# Patient Record
Sex: Female | Born: 1970 | Race: White | Hispanic: No | Smoking: Never smoker
Health system: Southern US, Community
[De-identification: ages and names within clinical notes are randomized; demographics above are authoritative.]

## PROBLEM LIST (undated history)

## (undated) HISTORY — PX: CHOLECYSTECTOMY: SHX55

## (undated) HISTORY — PX: TUBAL LIGATION: SHX77

---

## 2003-09-01 ENCOUNTER — Other Ambulatory Visit: Admission: RE | Admit: 2003-09-01 | Discharge: 2003-09-01 | Payer: Self-pay | Admitting: Obstetrics and Gynecology

## 2006-09-13 ENCOUNTER — Emergency Department (HOSPITAL_COMMUNITY): Admission: EM | Admit: 2006-09-13 | Discharge: 2006-09-13 | Payer: Self-pay | Admitting: Emergency Medicine

## 2006-10-29 ENCOUNTER — Ambulatory Visit (HOSPITAL_COMMUNITY): Admission: RE | Admit: 2006-10-29 | Discharge: 2006-10-30 | Payer: Self-pay | Admitting: Surgery

## 2006-10-29 ENCOUNTER — Encounter (INDEPENDENT_AMBULATORY_CARE_PROVIDER_SITE_OTHER): Payer: Self-pay | Admitting: Surgery

## 2007-05-25 ENCOUNTER — Emergency Department (HOSPITAL_COMMUNITY): Admission: EM | Admit: 2007-05-25 | Discharge: 2007-05-25 | Payer: Self-pay | Admitting: Family Medicine

## 2008-04-20 IMAGING — US US ABDOMEN COMPLETE
1 series · 14 of 25 positions shown · non-contrast
Comparison: none

CLINICAL DATA: Abdominal pain, nausea, and vomiting.
 ABDOMEN ULTRASOUND COMPLETE ? 09/13/06:
TECHNIQUE: Complete abdominal ultrasound examination was performed including evaluation of the liver, gallbladder, bile ducts, pancreas, kidneys, spleen, IVC, and abdominal aorta.
 No prior studies available for comparison.

[Series 1: unknown · 0.37mm/px · 14 of 62 slices shown]
[im 1/62]
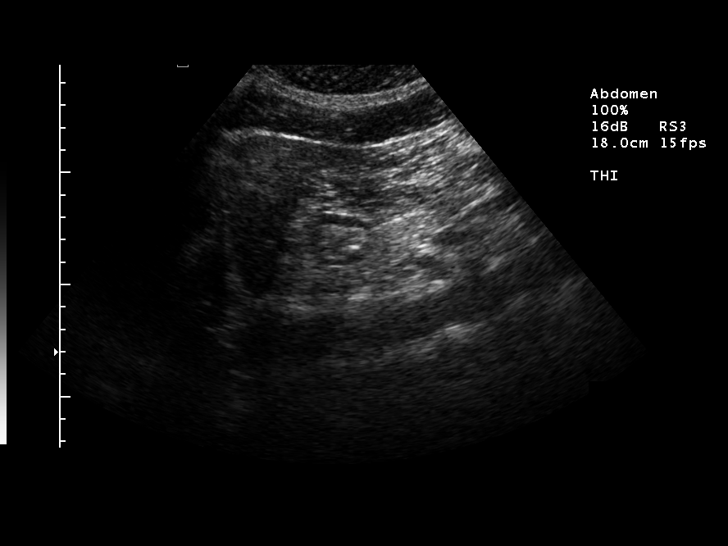
[im 6/62]
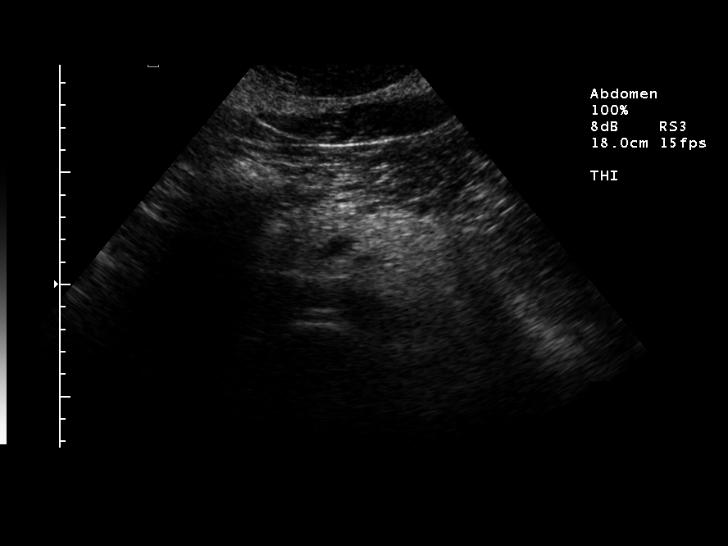
[im 11/62]
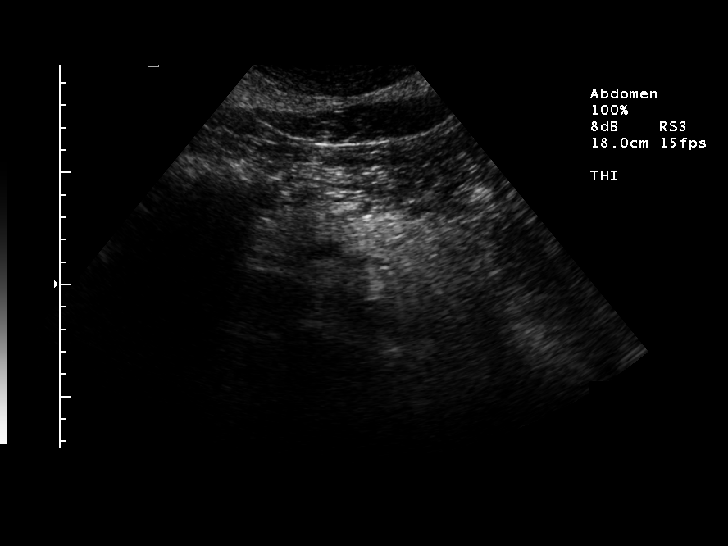
[im 16/62]
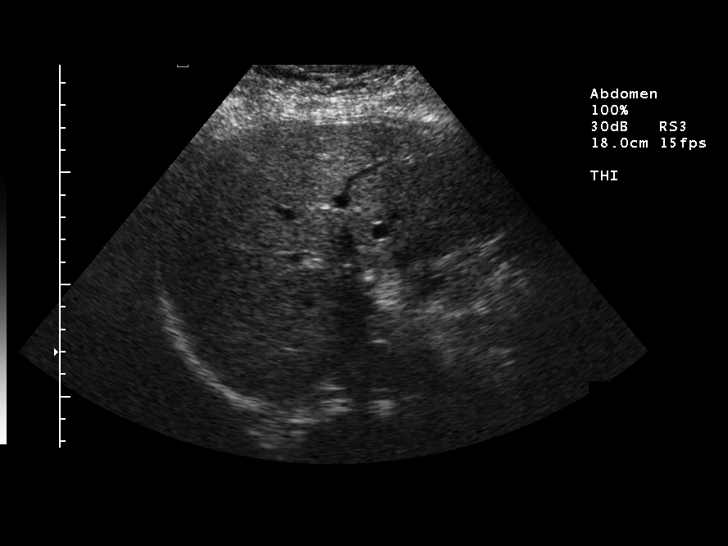
[im 21/62]
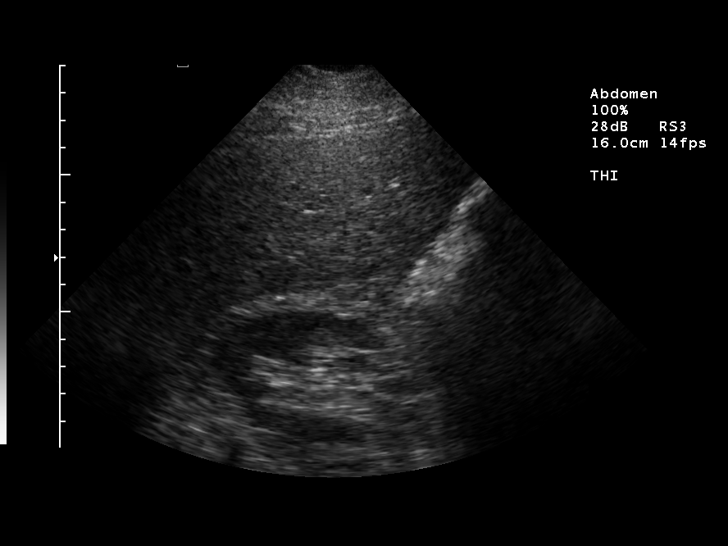
[im 23/62]
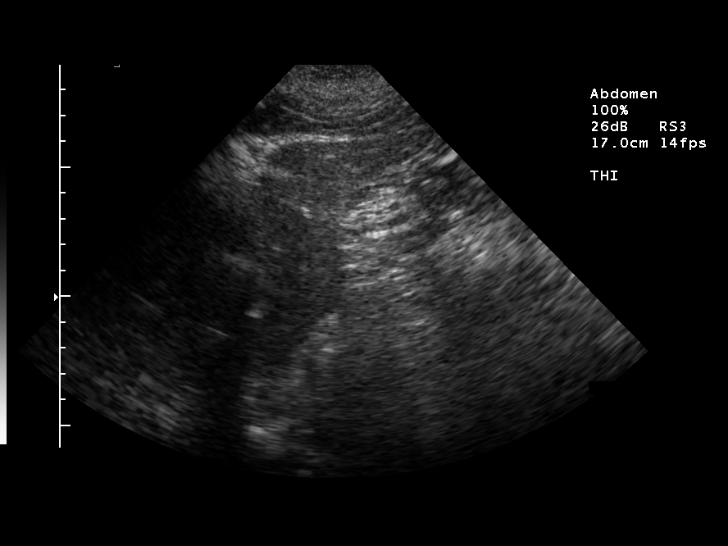
[im 28/62]
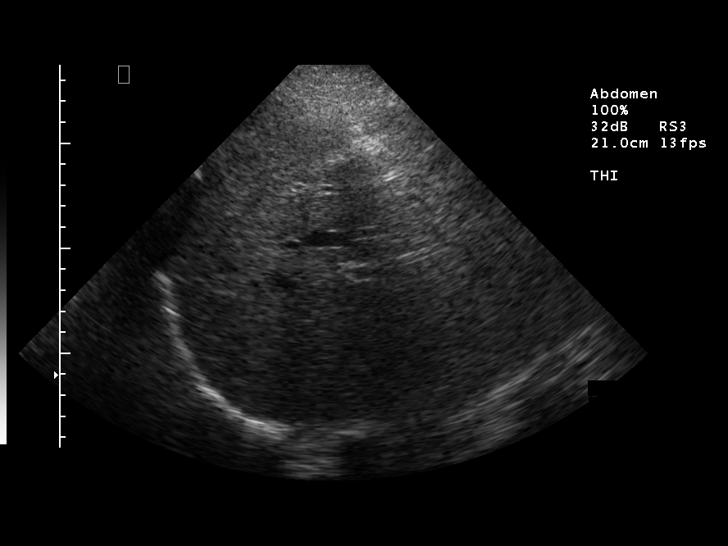
[im 34/62]
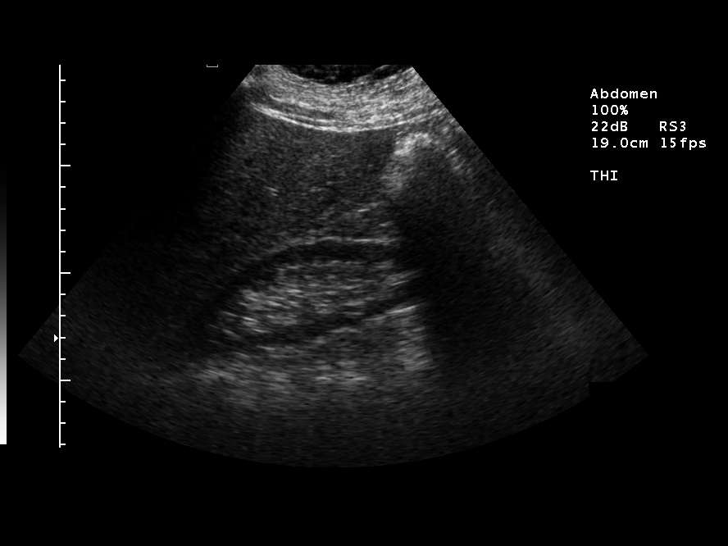
[im 39/62]
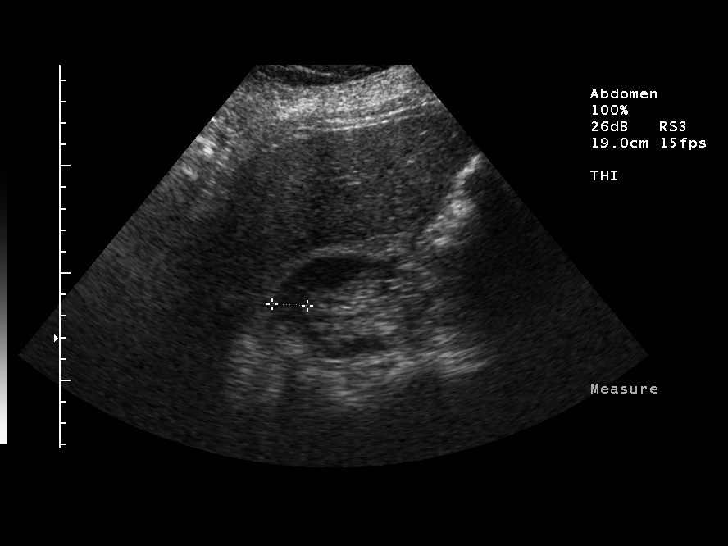
[im 41/62]
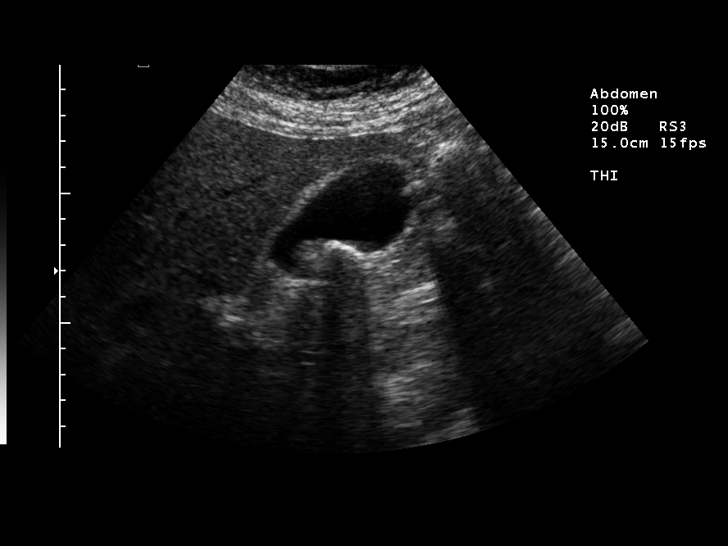
[im 46/62]
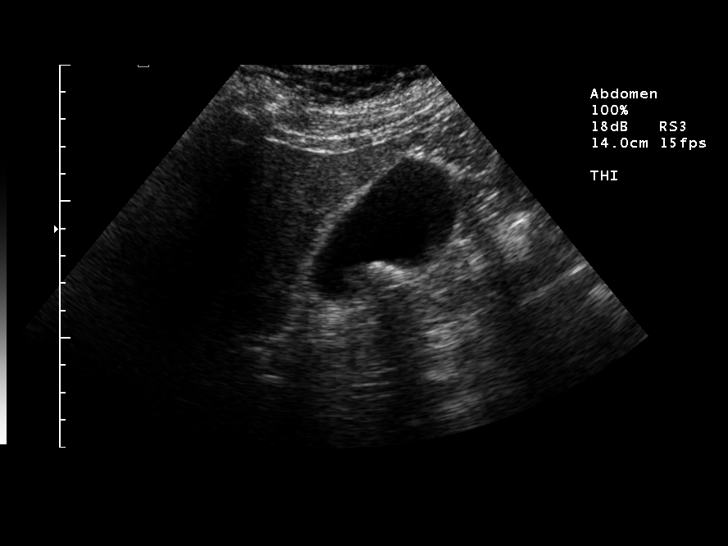
[im 51/62]
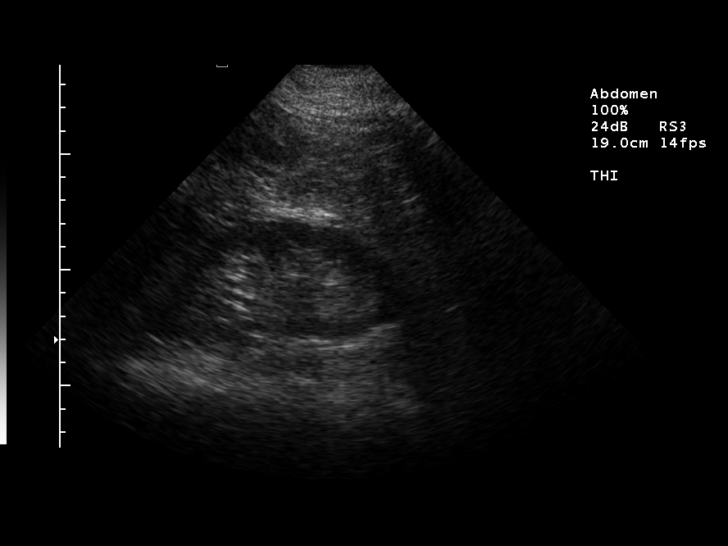
[im 56/62]
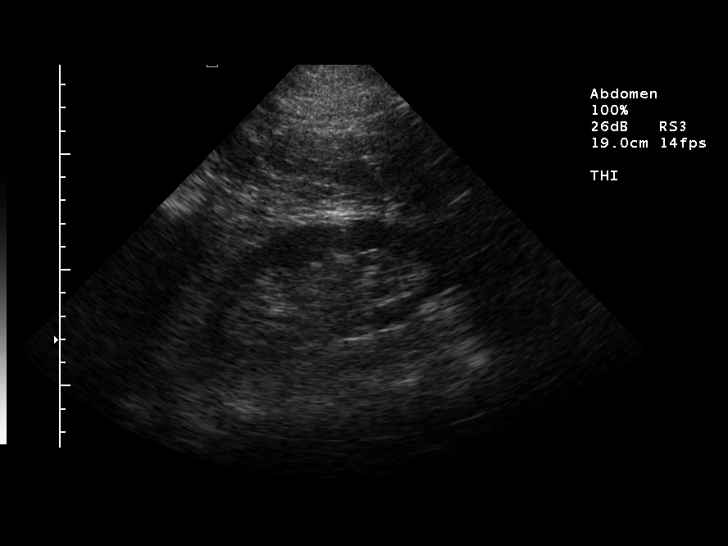
[im 62/62]
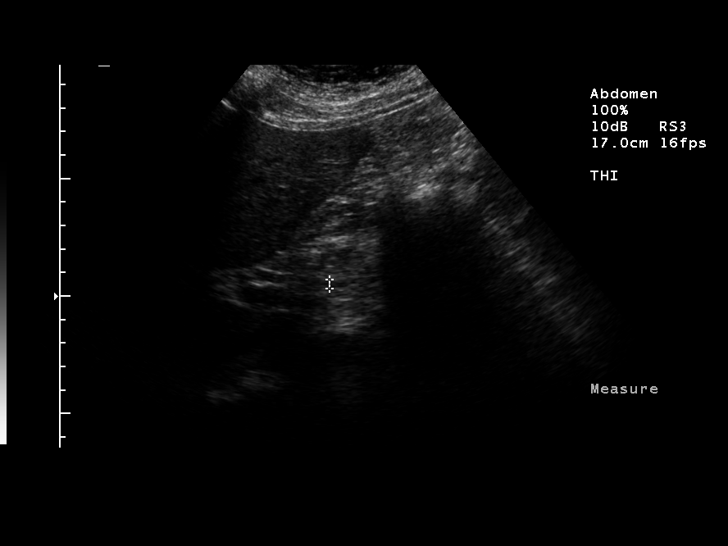

[14 of 25 positions shown; findings below may reference images not displayed]

FINDINGS: Gallbladder:  Multiple mobile gallstones are present demonstrating posterior acoustical shadowing.  The gallbladder wall is of borderline thickness measuring up to 3 mm.  No pericholecystic fluid is seen.
 Common Bile Duct:  Normal caliber.  3.8 mm.
 Liver:  Mild fatty infiltration.  No focal abnormalities or intrahepatic biliary ductal dilatation.
 IVC:  Normally patent.
 Pancreas:  Normal by ultrasound.
 Spleen:  Normal echotexture and size.
 Kidneys:  Normal bilaterally.  Right 13.5 cm.  Left 12.7 cm.
 Abdominal Aorta:  Normal caliber.
IMPRESSION: Cholelithiasis with multiple gallstones.  Borderline wall thickness of the gallbladder.  No evidence of biliary obstruction.

## 2008-06-05 IMAGING — RF DG CHOLANGIOGRAM OPERATIVE
1 series · 20 of 20 positions shown · non-contrast
Comparison: none

CLINICAL DATA: OPERATIVE CHOLANGIOGRAM:

[Series 1: run · 20 of 20 slices shown]
[im 1/20]
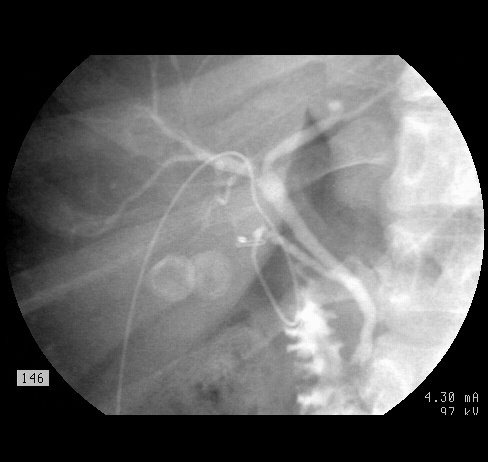
[im 2/20]
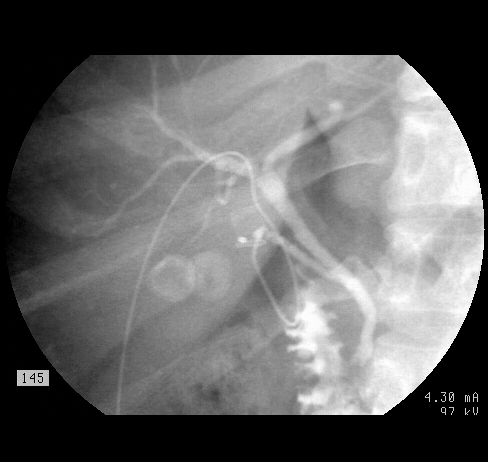
[im 3/20]
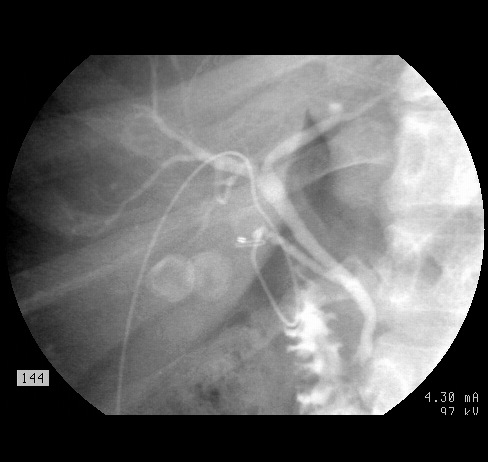
[im 4/20]
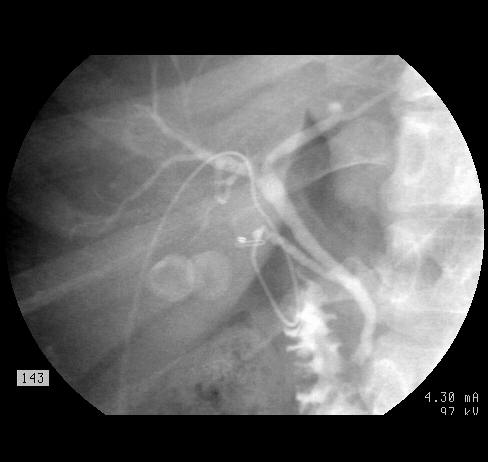
[im 5/20]
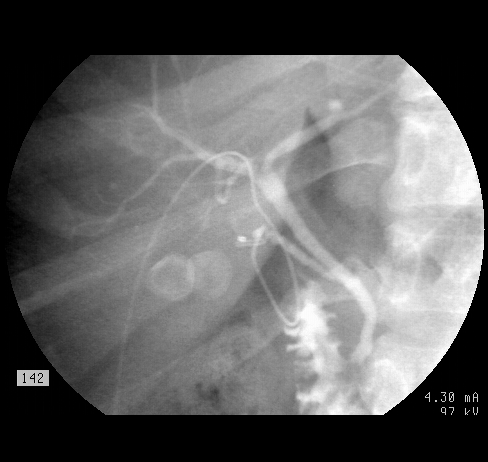
[im 6/20]
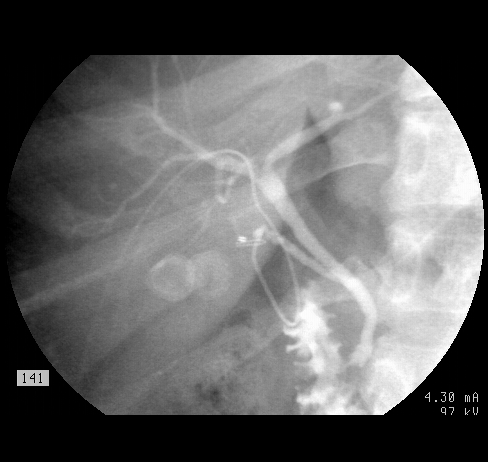
[im 7/20]
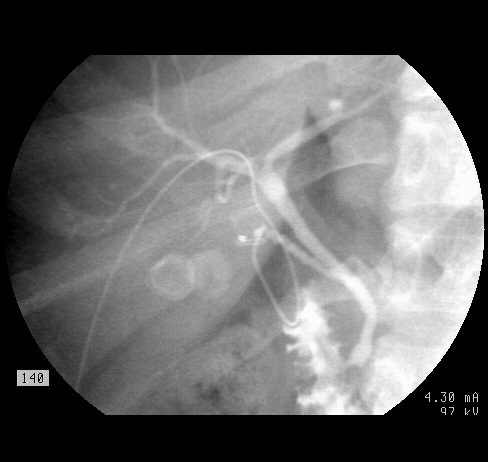
[im 8/20]
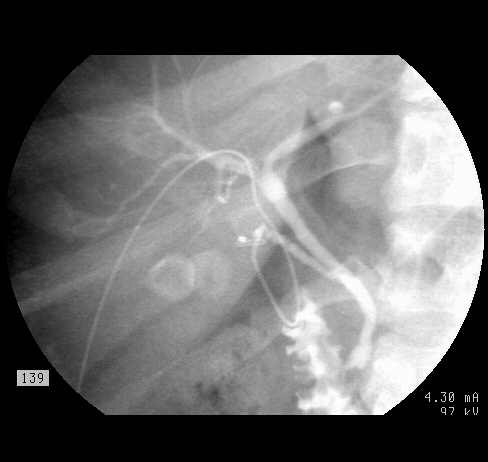
[im 9/20]
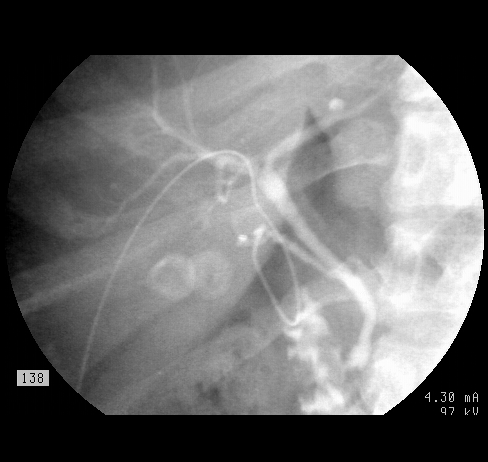
[im 10/20]
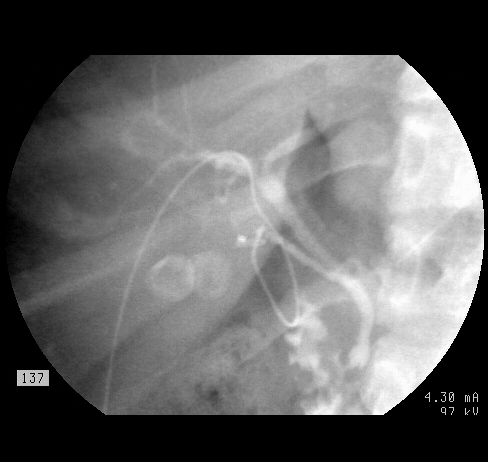
[im 11/20]
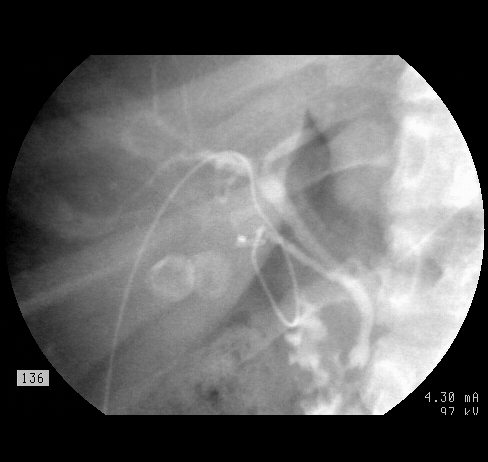
[im 12/20]
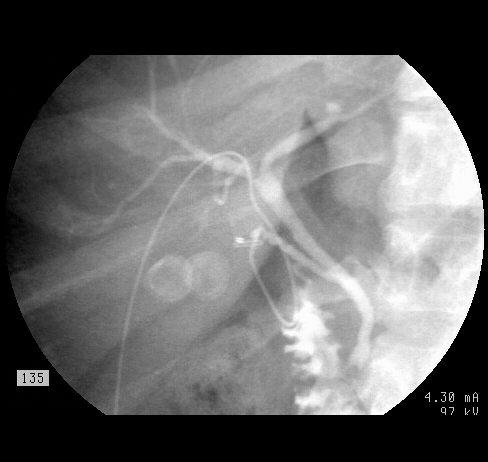
[im 13/20]
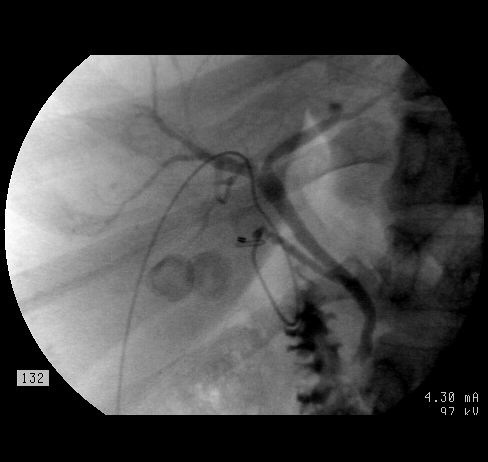
[im 14/20]
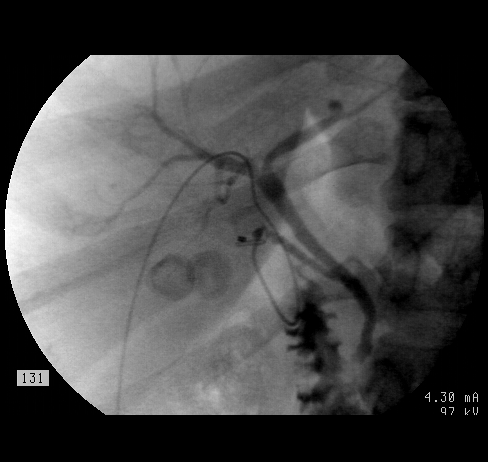
[im 15/20]
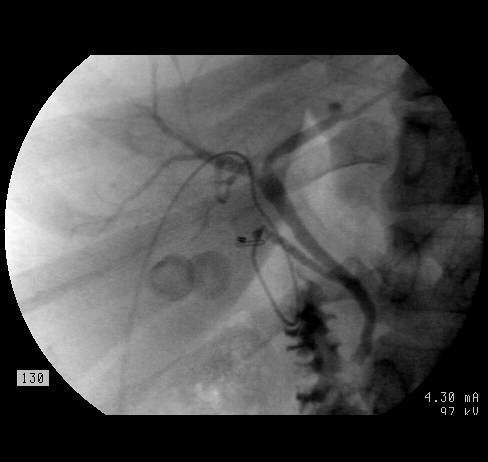
[im 16/20]
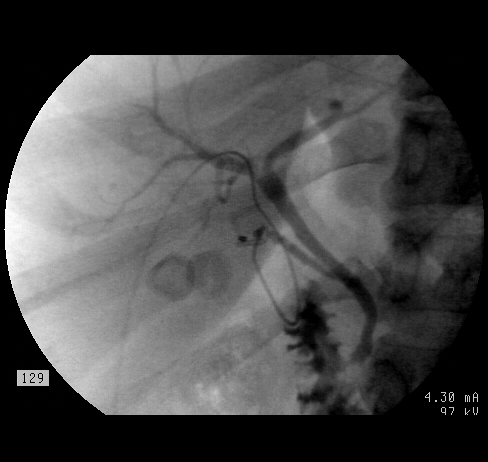
[im 17/20]
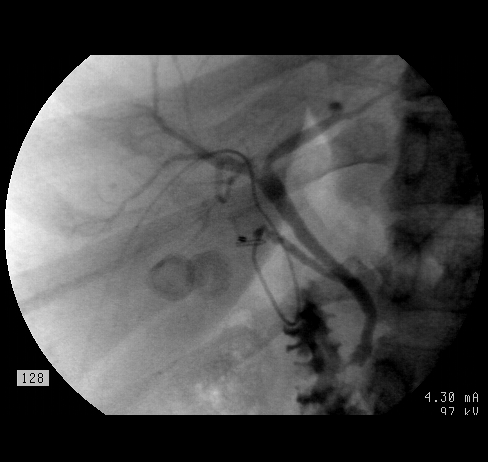
[im 18/20]
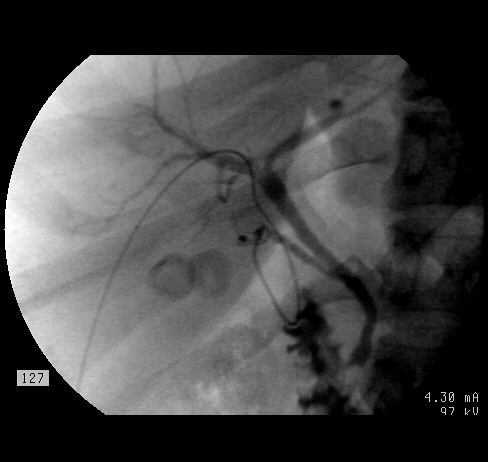
[im 19/20]
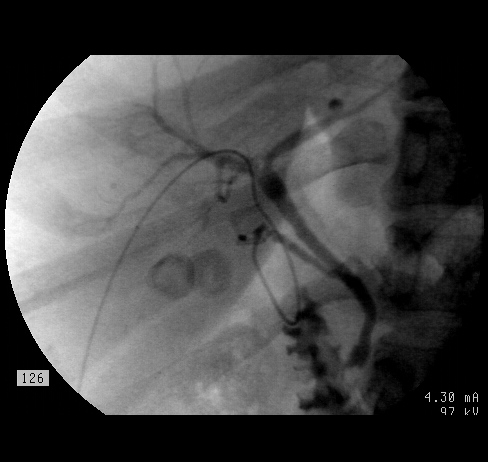
[im 20/20]
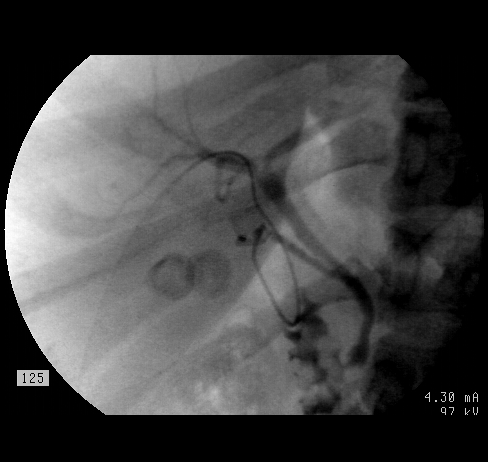

[20 of 20 positions shown; findings below may reference images not displayed]

FINDINGS: C-arm spot films from an operative cholangiogram were returned.  Contrast was injected via the cystic duct.  There is good visualization of the common bile duct. No retained calculi are seen and there is passage of contrast into the duodenum.
IMPRESSION: Negative intraoperative cholangiogram.

## 2010-02-09 DEATH — deceased

## 2010-09-24 NOTE — Op Note (Signed)
NAMEMarland Miles  ELMIRA, OLKOWSKI NO.:  [id number]   MEDICAL RECORD NO.:  [id number]          PATIENT TYPE:  OIB   LOCATION:  0098                         FACILITY:  [facility]   PHYSICIAN:  Wilmon Arms. Corliss Skains, M.D. DATE OF BIRTH:  1970-11-13   DATE OF PROCEDURE:  10/29/2006  DATE OF DISCHARGE:                               OPERATIVE REPORT   PREOPERATIVE DIAGNOSIS:  Chronic calculus cholecystitis.   POSTOPERATIVE DIAGNOSIS:  Chronic calculus cholecystitis.   PROCEDURE PERFORMED:  Laparoscopic cholecystectomy with intraoperative  cholangiogram.   SURGEON:  Wilmon Arms. Corliss Skains, M.D.   ASSISTANT:  Velora Heckler, MD   ANESTHESIA:  General endotracheal.   INDICATIONS:  The patient is a 40 year old female who presents after  recent acute episode of upper abdominal pain, nausea and vomiting.  White count was obtained which showed it to be elevated at 14.3.  Liver  functions were normal.  The ultrasound showed several large gallstones  with borderline wall thickening.  The patient's symptoms have resolved.  The patient has had previous episodes.  She now presents for elective  cholecystectomy.   DESCRIPTION OF PROCEDURE:  The patient is brought to the operating room,  placed in supine position on operating table.  After an adequate level  of general anesthesia was obtained, the patient's abdomen was prepped  with Betadine and draped in sterile fashion.  A time-out was taken  assure proper patient, proper procedure.  The patient's umbilicus  infiltrated 0.25% Marcaine.  A transverse incision was made below  umbilicus.  Dissection was carried down to the fascia which was opened  vertically.  The peritoneal cavity was bluntly entered.  A stay sutures  of Vicryl as placed around the fascial opening.  The Hasson cannula was  inserted and secured with a stay suture.  Pneumoperitoneum is obtained  by insufflating CO2 maintaining maximal pressure of 15 mmHg.  The  patient was tilted in  reverse Trendelenburg and tilted to the left.  A  10 mm port was placed in subxiphoid position.  Two 5 mm ports placed in  right upper quadrant.  The gallbladder was grasped with clamp and  elevated over the edge of the liver.  The omental adhesions were taken  down with cautery.  We opened the peritoneum around the hilum of the  gallbladder.  The cystic duct was identified and was circumferentially  dissected.  It was ligated with clip distally.  An opening was created  on the cystic duct.  A Cook cholangiogram catheter was brought out  through a stab incision threaded in the cystic duct.  A cholangiogram  was obtained which showed good flow proximally and distally in the  biliary tree with a very long cystic duct stump.  Contrast flowed easily  into the duodenum.  At the ampulla there seemed to be some tortuosity of  the duct but no clear filling defect.  We confirmed with the radiologist  who agreed that there was no definite obstruction here.  The patient's  preoperative liver functions were normal.  We chose to terminate the  cholangiogram at this time.  The catheter  was then removed and the  cystic duct was ligated, clipped and divided.  The cystic artery was  also ligated with clips and divided.  Cautery was then used to remove  the gallbladder from the liver bed.  The gallbladder was placed in  EndoCatch sac and removed through the umbilical port site.  We  reinspected right upper quadrant for bleeding.  Pneumoperitoneum was  released and the ports were  removed.  The stay sutures used to close the umbilical fascia.  4-0  Monocryl was used to close skin incisions.  Steri-Strips and clean  dressings were applied.  The patient was extubated and brought to  recovery in stable condition.  All sponge, instrument, needle counts  correct.      Wilmon Arms. Tsuei, M.D.  Electronically Signed     MKT/MEDQ  D:  10/29/2006  T:  10/29/2006  Job:  [id number]

## 2011-02-26 LAB — DIFFERENTIAL
Eosinophils Absolute: 0.1
Lymphocytes Relative: 22
Lymphs Abs: 2.7
Neutro Abs: 8.5 — ABNORMAL HIGH
Neutrophils Relative %: 69

## 2011-02-26 LAB — COMPREHENSIVE METABOLIC PANEL
Albumin: 3.3 — ABNORMAL LOW
BUN: 10
Calcium: 8.9
Creatinine, Ser: 0.56
Glucose, Bld: 94
Total Protein: 7

## 2011-02-26 LAB — CBC
Hemoglobin: 10 — ABNORMAL LOW
MCHC: 31.5
MCV: 69.9 — ABNORMAL LOW
RBC: 4.56
RDW: 16.1 — ABNORMAL HIGH

## 2011-05-11 ENCOUNTER — Emergency Department (HOSPITAL_BASED_OUTPATIENT_CLINIC_OR_DEPARTMENT_OTHER)
Admission: EM | Admit: 2011-05-11 | Discharge: 2011-05-11 | Disposition: A | Discharge status: Home / Self Care | Payer: Managed Care, Other (non HMO) | Attending: Emergency Medicine | Admitting: Emergency Medicine

## 2011-05-11 DIAGNOSIS — J069 Acute upper respiratory infection, unspecified: Secondary | ICD-10-CM

## 2011-05-11 DIAGNOSIS — J40 Bronchitis, not specified as acute or chronic: Secondary | ICD-10-CM | POA: Insufficient documentation

## 2011-05-11 MED ORDER — ALBUTEROL SULFATE HFA 108 (90 BASE) MCG/ACT IN AERS: 1.0000 | INHALATION_SPRAY | Freq: Four times a day (QID) | RESPIRATORY_TRACT | Status: AC | PRN

## 2011-05-11 MED ORDER — HYDROCOD POLST-CHLORPHEN POLST 10-8 MG/5ML PO LQCR: 5.0000 mL | Freq: Two times a day (BID) | ORAL | Status: AC

## 2011-05-11 NOTE — ED Provider Notes (Signed)
History     CSN: [id number]  Arrival date & time 05/11/11  1119   First MD Initiated Contact with Patient 05/11/11 1208      Chief Complaint  Patient presents with  . URI   Patient reports sore throat, nasal congestion, cough for one week. She's had some intermittent temperatures around 100. She states she initially thought she was getting better but then this morning had worsening cough, congestion, and stuffy nose. She's had no vomiting, or chest pain. No dizziness or syncope. She was initially using Tussionex, which was helping her. (Consider location/radiation/quality/duration/timing/severity/associated sxs/prior treatment) HPI  History reviewed. No pertinent past medical history.  Past Surgical History  Procedure Date  . Cesarean section   . Cholecystectomy   . Tubal ligation     No family history on file.  History  Substance Use Topics  . Smoking status: Never Smoker   . Smokeless tobacco: Not on file  . Alcohol Use: Yes    OB History    Grav Para Term Preterm Abortions TAB SAB Ect Mult Living                  Review of Systems  All other systems reviewed and are negative.    Allergies  Review of patient's allergies indicates no known allergies.  Home Medications  No current outpatient prescriptions on file.  BP 137/70  Pulse 112  Temp(Src) 98.6 F (37 C) (Oral)  Resp 20  Ht 5\' 5"  (1.651 m)  Wt 260 lb (117.935 kg)  BMI 43.27 kg/m2  SpO2 100%  LMP 05/01/2011  Physical Exam  Nursing note and vitals reviewed. Constitutional: She is oriented to person, place, and time. She appears well-developed and well-nourished.  HENT:  Head: Normocephalic and atraumatic.  Mouth/Throat: Oropharynx is clear and moist. No oropharyngeal exudate.  Eyes: Conjunctivae and EOM are normal. Pupils are equal, round, and reactive to light.  Neck: Neck supple.  Cardiovascular: Normal rate and regular rhythm.  Exam reveals no gallop and no friction rub.   No murmur  heard. Pulmonary/Chest: Effort normal and breath sounds normal. No respiratory distress. She has no wheezes. She has no rales. She exhibits no tenderness.       Dry cough. Otherwise, normal. No wheezing, rhonchi or rails  Abdominal: Soft. Bowel sounds are normal. She exhibits no distension. There is no tenderness. There is no rebound and no guarding.  Musculoskeletal: Normal range of motion.  Neurological: She is alert and oriented to person, place, and time. No cranial nerve deficit. Coordination normal.  Skin: Skin is warm and dry. No rash noted.  Psychiatric: She has a normal mood and affect.    ED Course  Procedures (including critical care time)  Labs Reviewed - No data to display No results found.   No diagnosis found.    MDM  Pt is seen and examined;  Initial history and physical completed.  Will follow.          Tayna Smethurst A. Patrica Duel, MD 05/11/11 [phone]

## 2011-05-11 NOTE — ED Notes (Signed)
C/o sore throat, nasal congestion, fever x 1 week

## 2013-05-20 ENCOUNTER — Encounter: Payer: Self-pay | Admitting: Internal Medicine

## 2013-05-20 ENCOUNTER — Ambulatory Visit: Payer: Self-pay | Attending: Internal Medicine | Admitting: Internal Medicine

## 2013-05-20 VITALS — BP 129/83 | HR 117 | Temp 98.7°F | Resp 14 | Ht 66.0 in | Wt 274.6 lb

## 2013-05-20 DIAGNOSIS — N946 Dysmenorrhea, unspecified: Secondary | ICD-10-CM | POA: Insufficient documentation

## 2013-05-20 DIAGNOSIS — R0602 Shortness of breath: Secondary | ICD-10-CM

## 2013-05-20 DIAGNOSIS — N644 Mastodynia: Secondary | ICD-10-CM

## 2013-05-20 DIAGNOSIS — M25552 Pain in left hip: Secondary | ICD-10-CM

## 2013-05-20 DIAGNOSIS — M25559 Pain in unspecified hip: Secondary | ICD-10-CM

## 2013-05-20 DIAGNOSIS — N949 Unspecified condition associated with female genital organs and menstrual cycle: Secondary | ICD-10-CM | POA: Insufficient documentation

## 2013-05-20 LAB — COMPLETE METABOLIC PANEL WITH GFR
ALBUMIN: 3.8 g/dL (ref 3.5–5.2)
ALK PHOS: 94 U/L (ref 39–117)
ALT: 8 U/L (ref 0–35)
AST: 9 U/L (ref 0–37)
BILIRUBIN TOTAL: 0.4 mg/dL (ref 0.3–1.2)
BUN: 11 mg/dL (ref 6–23)
CO2: 25 mEq/L (ref 19–32)
Calcium: 9 mg/dL (ref 8.4–10.5)
Chloride: 102 mEq/L (ref 96–112)
Creat: 0.66 mg/dL (ref 0.50–1.10)
GFR, Est African American: >89 mL/min
GLUCOSE: 115 mg/dL — AB (ref 70–99)
POTASSIUM: 3.8 meq/L (ref 3.5–5.3)
SODIUM: 134 meq/L — AB (ref 135–145)
Total Protein: 7.4 g/dL (ref 6.0–8.3)

## 2013-05-20 LAB — LIPID PANEL
CHOL/HDL RATIO: 3.9 ratio
Cholesterol: 129 mg/dL (ref 0–200)
HDL: 33 mg/dL — AB (ref >39)
LDL Cholesterol: 65 mg/dL (ref 0–99)
TRIGLYCERIDES: 154 mg/dL — AB (ref <150)
VLDL: 31 mg/dL (ref 0–40)

## 2013-05-20 LAB — CBC WITH DIFFERENTIAL/PLATELET
BASOS ABS: 0.1 10*3/uL (ref 0.0–0.1)
BASOS PCT: 1 % (ref 0–1)
EOS ABS: 0.4 10*3/uL (ref 0.0–0.7)
Eosinophils Relative: 3 % (ref 0–5)
HCT: 31.4 % — ABNORMAL LOW (ref 36.0–46.0)
Hemoglobin: 9.8 g/dL — ABNORMAL LOW (ref 12.0–15.0)
Lymphocytes Relative: 23 % (ref 12–46)
Lymphs Abs: 3.4 10*3/uL (ref 0.7–4.0)
MCH: 20.1 pg — AB (ref 26.0–34.0)
MCHC: 31.2 g/dL (ref 30.0–36.0)
MCV: 64.3 fL — ABNORMAL LOW (ref 78.0–100.0)
MONO ABS: 1.3 10*3/uL — AB (ref 0.1–1.0)
Monocytes Relative: 9 % (ref 3–12)
NEUTROS ABS: 9.6 10*3/uL — AB (ref 1.7–7.7)
NEUTROS PCT: 64 % (ref 43–77)
Platelets: 406 10*3/uL — ABNORMAL HIGH (ref 150–400)
RBC: 4.88 MIL/uL (ref 3.87–5.11)
RDW: 16.4 % — AB (ref 11.5–15.5)
WBC: 14.8 10*3/uL — ABNORMAL HIGH (ref 4.0–10.5)

## 2013-05-20 LAB — POCT GLYCOSYLATED HEMOGLOBIN (HGB A1C): HEMOGLOBIN A1C: 5.2

## 2013-05-20 MED ORDER — FUROSEMIDE 20 MG PO TABS: 20.0000 mg | ORAL_TABLET | Freq: Every day | ORAL | Status: AC

## 2013-05-20 NOTE — Progress Notes (Signed)
Pt is here to establish care. Complains of bilateral feet and leg swelling x3-5 years. Also complains of mood swings and highly stressed. Mother was recently diagnosised with bipolar and schizophrenia. Pt is worried that she could have a similar diagnosis. Extremely heavy and painful menstrual periods x4 months. Wants to know what could be done about.

## 2013-05-20 NOTE — Progress Notes (Signed)
Patient ID: Sara Miles, female   DOB: [birth year], 43 y.o.   MRN: [id number]   CC:  HPI: 43 year old female here to establish care. She has multiple complaints. She feels depressed. She has gained about 50 pounds in the last 20 years. Does not exercise. She complains of bilateral lower extremity swelling. She has dyspnea on exertion no chest pain. She complains of pelvic pain and severe dysmenorrhea during her menstrual cycles with heavy bleeding, this has been going on for about 4 months. She had a Pap smear 2 years ago. , She has a desk job  Social history She is a nonsmoker nonalcoholic Family history positive for hypertension in both parents  No Known Allergies History reviewed. No pertinent past medical history. Current Outpatient Prescriptions on File Prior to Visit  Medication Sig Dispense Refill  . albuterol (PROVENTIL HFA;VENTOLIN HFA) 108 (90 BASE) MCG/ACT inhaler Inhale 1-2 puffs into the lungs every 6 (six) hours as needed for wheezing.  1 Inhaler  0  . chlorpheniramine-HYDROcodone (TUSSIONEX PENNKINETIC ER) 10-8 MG/5ML LQCR Take 5 mLs by mouth every 12 (twelve) hours.  100 mL  0   No current facility-administered medications on file prior to visit.   Family History  Problem Relation Age of Onset  . Cancer Father   . Heart disease Maternal Grandmother    History   Social History  . Marital Status: Divorced    Spouse Name: N/A    Number of Children: N/A  . Years of Education: N/A   Occupational History  . Not on file.   Social History Main Topics  . Smoking status: Never Smoker   . Smokeless tobacco: Not on file  . Alcohol Use: Yes  . Drug Use: No  . Sexual Activity: Yes    Birth Control/ Protection: Surgical   Other Topics Concern  . Not on file   Social History Narrative  . No narrative on file    Review of Systems  Constitutional: As in history of present illness  HENT: Negative for ear pain, nosebleeds, congestion, facial swelling, rhinorrhea,  neck pain, neck stiffness and ear discharge.   Eyes: Negative for pain, discharge, redness, itching and visual disturbance.  Respiratory: As in history of present illness Cardiovascular: Negative for chest pain, palpitations and leg swelling.  Gastrointestinal: Negative for abdominal distention.  Genitourinary: Negative for dysuria, urgency, frequency, hematuria, flank pain, decreased urine volume, difficulty urinating and dyspareunia.  Musculoskeletal: Negative for back pain, positive for bilateral leg swelling arthralgias and gait problem.  Neurological: Negative for dizziness, tremors, seizures, syncope, facial asymmetry, speech difficulty, weakness, light-headedness, numbness and headaches.  Hematological: Negative for adenopathy. Does not bruise/bleed easily.  Psychiatric/Behavioral: Negative for hallucinations, behavioral problems, confusion, dysphoric mood, decreased concentration and agitation.    Objective:   Filed Vitals:   05/20/13 1435  BP: 129/83  Pulse: 117  Temp: 98.7 F (37.1 C)  Resp: 14    Physical Exam  Constitutional: Appears well-developed and well-nourished. No distress.  HENT: Normocephalic. External right and left ear normal. Oropharynx is clear and moist.  Eyes: Conjunctivae and EOM are normal. PERRLA, no scleral icterus.  Neck: Normal ROM. Neck supple. No JVD. No tracheal deviation. No thyromegaly.  CVS: RRR, S1/S2 +, no murmurs, no gallops, no carotid bruit.  Pulmonary: Effort and breath sounds normal, no stridor, rhonchi, wheezes, rales.  Abdominal: Soft. BS +,  no distension, tenderness, rebound or guarding.  Musculoskeletal: Normal range of motion. 1+ pitting edema and no tenderness.  Lymphadenopathy: No lymphadenopathy  noted, cervical, inguinal. Neuro: Alert. Normal reflexes, muscle tone coordination. No cranial nerve deficit. Skin: Skin is warm and dry. No rash noted. Not diaphoretic. No erythema. No pallor.  Psychiatric: Normal mood and affect.  Behavior, judgment, thought content normal.   Lab Results  Component Value Date   WBC 12.4* 10/27/2006   HGB 10.0* 10/27/2006   HCT 31.9* 10/27/2006   MCV 69.9* 10/27/2006   PLT 374 10/27/2006   Lab Results  Component Value Date   CREATININE 0.56 10/27/2006   BUN 10 10/27/2006   NA 138 10/27/2006   K 3.6 10/27/2006   CL 105 10/27/2006   CO2 26 10/27/2006    No results found for this basename: HGBA1C   Lipid Panel  No results found for this basename: chol, trig, hdl, cholhdl, vldl, ldlcalc       Assessment and plan:   There are no active problems to display for this patient.      Dependent edema, shortness of breath Obesity hypoventilation syndrome, sleep apnea,? 2-D echo Lasix 20 mg a day Strongly advised to lose weight   Pelvic pain/dysmenorrhea Pelvic ultrasound Gynecologic referral to rule out fibroids Routine mammogram   Establish care Mammogram Pap smear referral Labs  Follow up in 2 months    The patient was given clear instructions to go to ER or return to medical center if symptoms don't improve, worsen or new problems develop. The patient verbalized understanding. The patient was told to call to get any lab results if not heard anything in the next week.

## 2013-05-21 LAB — VITAMIN D 25 HYDROXY (VIT D DEFICIENCY, FRACTURES): Vit D, 25-Hydroxy: 24 ng/mL — ABNORMAL LOW (ref 30–89)

## 2013-05-21 LAB — TSH: TSH: 1.547 u[IU]/mL (ref 0.350–4.500)

## 2013-05-23 ENCOUNTER — Telehealth: Payer: Self-pay | Admitting: *Deleted

## 2013-05-23 MED ORDER — FERROUS SULFATE 300 (60 FE) MG/5ML PO SYRP
325.0000 mg | ORAL_SOLUTION | Freq: Two times a day (BID) | ORAL | Status: AC
Start: 2013-05-23 — End: ?

## 2013-05-23 NOTE — Telephone Encounter (Signed)
Tried contacting pt and home telephone number was wrong. Prescription is already sent to her pharmacy.

## 2013-05-23 NOTE — Telephone Encounter (Signed)
Message copied by Kellie Murrill, [place] on Mon May 23, 2013  4:51 PM ------      Message from: Susie CassetteABROL MD, [facility]      Created: Mon May 23, 2013 12:08 PM       Please notify patient of the patient's labs are abnormal, white count is 14.8. He scheduled the patient for a chest x-ray two-view, a urine dipstick. Hemoglobin is 9.8. She should see a gynecologist for her abnormal vaginal bleeding.      Please call in a prescription for ferrous sulfate 325 mg by mouth twice a day, 60 tablets with 2 refills.            Vitamin D is 24. Patient can start over-the-counter vitamin D. 2000 international unit tablets twice a day       ------

## 2013-05-26 ENCOUNTER — Ambulatory Visit (HOSPITAL_COMMUNITY): Payer: Managed Care, Other (non HMO)

## 2013-06-03 ENCOUNTER — Ambulatory Visit: Payer: Managed Care, Other (non HMO)

## 2013-06-23 ENCOUNTER — Encounter: Payer: Managed Care, Other (non HMO) | Admitting: Obstetrics & Gynecology

## 2013-07-18 ENCOUNTER — Ambulatory Visit: Payer: Managed Care, Other (non HMO) | Admitting: Internal Medicine
